# Patient Record
Sex: Male | Born: 1985 | Hispanic: Yes | Marital: Married | Smoking: Never smoker
Health system: Southern US, Community
[De-identification: ages and names within clinical notes are randomized; demographics above are authoritative.]

---

## 2017-08-23 ENCOUNTER — Emergency Department (HOSPITAL_COMMUNITY)
Admission: EM | Admit: 2017-08-23 | Discharge: 2017-08-23 | Disposition: A | Discharge status: Home / Self Care | Payer: Self-pay | Attending: Emergency Medicine | Admitting: Emergency Medicine

## 2017-08-23 ENCOUNTER — Emergency Department (HOSPITAL_COMMUNITY): Payer: Self-pay

## 2017-08-23 ENCOUNTER — Other Ambulatory Visit: Payer: Self-pay

## 2017-08-23 ENCOUNTER — Encounter (HOSPITAL_COMMUNITY): Payer: Self-pay

## 2017-08-23 DIAGNOSIS — F419 Anxiety disorder, unspecified: Secondary | ICD-10-CM | POA: Insufficient documentation

## 2017-08-23 DIAGNOSIS — T43615A Adverse effect of caffeine, initial encounter: Secondary | ICD-10-CM | POA: Insufficient documentation

## 2017-08-23 LAB — CBC WITH DIFFERENTIAL/PLATELET
BASOS ABS: 0 10*3/uL (ref 0.0–0.1)
Basophils Relative: 0 %
EOS PCT: 0 %
Eosinophils Absolute: 0 10*3/uL (ref 0.0–0.7)
HEMATOCRIT: 43.8 % (ref 39.0–52.0)
Hemoglobin: 15.3 g/dL (ref 13.0–17.0)
LYMPHS PCT: 9 %
Lymphs Abs: 1.4 10*3/uL (ref 0.7–4.0)
MCH: 30.7 pg (ref 26.0–34.0)
MCHC: 34.9 g/dL (ref 30.0–36.0)
MCV: 87.8 fL (ref 78.0–100.0)
Monocytes Absolute: 0.6 10*3/uL (ref 0.1–1.0)
Monocytes Relative: 4 %
NEUTROS ABS: 13.4 10*3/uL — AB (ref 1.7–7.7)
Neutrophils Relative %: 87 %
PLATELETS: 264 10*3/uL (ref 150–400)
RBC: 4.99 MIL/uL (ref 4.22–5.81)
RDW: 12.6 % (ref 11.5–15.5)
WBC: 15.3 10*3/uL — AB (ref 4.0–10.5)

## 2017-08-23 LAB — BASIC METABOLIC PANEL
Anion gap: 12 (ref 5–15)
BUN: 13 mg/dL (ref 6–20)
CHLORIDE: 103 mmol/L (ref 98–111)
CO2: 23 mmol/L (ref 22–32)
CREATININE: 0.79 mg/dL (ref 0.61–1.24)
Calcium: 8.4 mg/dL — ABNORMAL LOW (ref 8.9–10.3)
GFR calc non Af Amer: >60 mL/min (ref >60)
Glucose, Bld: 144 mg/dL — ABNORMAL HIGH (ref 70–99)
Potassium: 3.6 mmol/L (ref 3.5–5.1)
SODIUM: 138 mmol/L (ref 135–145)

## 2017-08-23 LAB — URINALYSIS, ROUTINE W REFLEX MICROSCOPIC
BILIRUBIN URINE: NEGATIVE
Bacteria, UA: NONE SEEN
Glucose, UA: NEGATIVE mg/dL
Hgb urine dipstick: NEGATIVE
KETONES UR: NEGATIVE mg/dL
LEUKOCYTES UA: NEGATIVE
NITRITE: NEGATIVE
PROTEIN: 30 mg/dL — AB
Specific Gravity, Urine: 1.012 (ref 1.005–1.030)
pH: 6 (ref 5.0–8.0)

## 2017-08-23 LAB — I-STAT TROPONIN, ED: Troponin i, poc: 0 ng/mL (ref 0.00–0.08)

## 2017-08-23 LAB — RAPID URINE DRUG SCREEN, HOSP PERFORMED
Amphetamines: NOT DETECTED
Barbiturates: NOT DETECTED
Benzodiazepines: NOT DETECTED
COCAINE: NOT DETECTED
OPIATES: NOT DETECTED
Tetrahydrocannabinol: NOT DETECTED

## 2017-08-23 MED ORDER — SODIUM CHLORIDE 0.9 % IV BOLUS: 500.0000 mL | Freq: Once | INTRAVENOUS | Status: DC

## 2017-08-23 MED ORDER — LORAZEPAM 0.5 MG PO TABS
0.5000 mg | ORAL_TABLET | Freq: Once | ORAL | Status: AC
  Administered 2017-08-23: 0.5 mg via ORAL
  Filled 2017-08-23: qty 1

## 2017-08-23 MED ORDER — LORAZEPAM 2 MG/ML IJ SOLN: 1.0000 mg | Freq: Once | INTRAMUSCULAR | Status: DC

## 2017-08-23 NOTE — Discharge Instructions (Signed)
You can take Tylenol or Ibuprofen as directed for pain. You can alternate Tylenol and Ibuprofen every 4 hours. If you take Tylenol at 1pm, then you can take Ibuprofen at 5pm. Then you can take Tylenol again at 9pm.   As we discussed, do not drink any more energy drinks.  Additionally, you should limit your amount of caffeine.  Please follow-up with the referred [facility] for further evaluation.  Return to emergency department for any chest pain, difficulty breathing, fever, numbness or weakness in your arms or legs, vision changes or any other worsening or concerning symptoms.

## 2017-08-23 NOTE — ED Triage Notes (Signed)
Per EMS:  Pt came from urgent care, he had an energy drink at 6:30 am this am, and began to SOB, palpation, and anxiety around 8:00 am.  Pt then had c/o of numbness and tingling in extremities and lips.  Pt denies chest pain. [facility], pulse 113.  800 mL NS were administered by UC

## 2017-08-23 NOTE — ED Provider Notes (Signed)
[facility]-EMERGENCY DEPT Provider Note   CSN: [id number] Arrival date & time: 08/23/17  1101     History   Chief Complaint Chief Complaint  Patient presents with  . Panic Attack    HPI Steve Fuentes is a 32 y.o. male no significant past medical history arrives from urgent care for evaluation of anxiety.  Patient reports that he was at work and started having chest palpations and tightness, difficulty breathing, he started having numbness around his mouth and the tips of his fingers.  Patient reports that he was hyperventilating at the time.  Patient reports he went to urgent care and states that the symptoms are gotten better but patient was found to be tachycardic and hypertensive so they sent him to the emergency department.  Patient reports that he had similar symptoms yesterday after drinking two 16oz NOS energy drinks.  Patient reports he had an additional 12 ounce energy drink this morning prior to onset of symptoms.  On ED arrival, patient states that his symptoms have resolved completely.  He denies any pain at this time.  Patient states that he has not had any more caffeine.  Patient denies any smoking or cocaine use.  He denies any IV drug use.  Patient denies any fever, nausea/vomiting, abdominal pain, chest pain, difficulty breathing, numbness/weakness of his arms or legs.  The history is provided by the patient.    History reviewed. No pertinent past medical history.  There are no active problems to display for this patient.   History reviewed. No pertinent surgical history.      Home Medications    Prior to Admission medications   Not on File    Family History History reviewed. No pertinent family history.  Social History Social History   Tobacco Use  . Smoking status: Never Smoker  . Smokeless tobacco: Never Used  Substance Use Topics  . Alcohol use: Yes    Comment: "on weekends"  . Drug use: Never     Allergies   Patient has  no known allergies.   Review of Systems Review of Systems  Constitutional: Negative for fever.  Eyes: Negative for visual disturbance.  Respiratory: Positive for chest tightness (resolved) and shortness of breath (resolved).   Cardiovascular: Positive for palpitations. Negative for chest pain and leg swelling.  Psychiatric/Behavioral: The patient is nervous/anxious.   All other systems reviewed and are negative.    Physical Exam Updated Vital Signs BP 137/77 (BP Location: Left Arm)   Pulse 88   Temp 98.3 F (36.8 C) (Oral)   Resp (!) 31   Ht 5\' 6"  (1.676 m)   Wt 95.3 kg (210 lb)   SpO2 97%   BMI 33.89 kg/m   Physical Exam  Constitutional: He is oriented to person, place, and time. He appears well-developed and well-nourished.  HENT:  Head: Normocephalic and atraumatic.  Mouth/Throat: Oropharynx is clear and moist and mucous membranes are normal.  Eyes: Pupils are equal, round, and reactive to light. Conjunctivae, EOM and lids are normal.  Neck: Full passive range of motion without pain.  Cardiovascular: Normal rate, regular rhythm, normal heart sounds and normal pulses. Exam reveals no gallop and no friction rub.  No murmur heard. Pulses:      Radial pulses are 2+ on the right side, and 2+ on the left side.  Pulmonary/Chest: Effort normal and breath sounds normal.  Lungs clear to auscultation bilaterally.  Symmetric chest rise.  No wheezing, rales, rhonchi.  Abdominal: Soft. Normal  appearance. There is no tenderness. There is no rigidity and no guarding.  Abdomen is soft, non-distended, non-tender. No rigidity, No guarding. No peritoneal signs.  Musculoskeletal: Normal range of motion.  Neurological: He is alert and oriented to person, place, and time.  Skin: Skin is warm and dry. Capillary refill takes less than 2 seconds.  Psychiatric: His speech is normal. His mood appears anxious.  Nursing note and vitals reviewed.    ED Treatments / Results  Labs (all labs  ordered are listed, but only abnormal results are displayed) Labs Reviewed  URINALYSIS, ROUTINE W REFLEX MICROSCOPIC - Abnormal; Notable for the following components:      Result Value   Protein, ur 30 (*)    All other components within normal limits  CBC WITH DIFFERENTIAL/PLATELET - Abnormal; Notable for the following components:   WBC 15.3 (*)    Neutro Abs 13.4 (*)    All other components within normal limits  BASIC METABOLIC PANEL - Abnormal; Notable for the following components:   Glucose, Bld 144 (*)    Calcium 8.4 (*)    All other components within normal limits  RAPID URINE DRUG SCREEN, HOSP PERFORMED  I-STAT TROPONIN, ED    EKG EKG Interpretation  Date/Time:  Thursday August 23 2017 13:25:23 EDT Ventricular Rate:  100 PR Interval:    QRS Duration: 86 QT Interval:  339 QTC Calculation: 438 R Axis:   109 Text Interpretation:  Right and left arm electrode reversal, interpretation assumes no reversal Sinus tachycardia Probable lateral infarct, age indeterminate no prior to compare with Confirmed by Meridee Score [phone]) on 08/23/2017 1:47:38 PM   Radiology Dg Chest 2 View  Result Date: 08/23/2017 CLINICAL DATA:  Pt came from urgent care, he had an energy drink at 6:30 am this am, and began to SOB, palpation, and anxiety around 8:00 am. Pt then had c/o of numbness and tingling in extremities and lips. Pt denies chest pain. EXAM: CHEST - 2 VIEW COMPARISON:  None. FINDINGS: The heart size and mediastinal contours are within normal limits. Both lungs are clear. No pleural effusion or pneumothorax. The visualized skeletal structures are unremarkable. IMPRESSION: No active cardiopulmonary disease. Electronically Signed   By: Amie Portland M.D.   On: 08/23/2017 14:21    Procedures Procedures (including critical care time)  Medications Ordered in ED Medications  sodium chloride 0.9 % bolus 500 mL (500 mLs Intravenous Not Given 08/23/17 1502)  LORazepam (ATIVAN) tablet 0.5 mg  (0.5 mg Oral Given 08/23/17 1509)     Initial Impression / Assessment and Plan / ED Course  I have reviewed the triage vital signs and the nursing notes.  Pertinent labs & imaging results that were available during my care of the patient were reviewed by me and considered in my medical decision making (see chart for details).  Clinical Course as of Aug 23 1609  Thu Aug 23, 2017  [id number] 32 year old male here with shortness of breath and palpitations in the setting of drinking energy drinks.  He is otherwise well-appearing here.  His labs including troponin are unremarkable other than a mild elevation in his WBCs which is nonspecific.  He is feeling improved here 2 and barring a abnormal chest x-ray is likely to be discharged soon.   [MB]    Clinical Course User Index [MB] Terrilee Files, MD    31 y.o. M who presents for evaluation of anxiety, palpitations, chest tightness, shortness of breath began this morning.  Patient reports that he  drinks 216 ounce energy drink yesterday followed by another 16 ounce energy drinks and coffee this morning.  Patient reports that shortly after consuming the strings, he started feeling very anxious felt like his heart was beating very fast.  He describes a tightness.  Patient became very anxious.  He was taken to urgent care.  At that time, he is not been tachycardic, hypertensive and tachypneic.  Patient was sent to the ED for further evaluation.  On ED arrival, patient reports his pain is symptomatically improved.  He states he does not have any chest pain or difficulty breathing.  Additionally his numbness has improved.  Patient does report he still feels nervous.  Sometimes having palpitations.  Patient has no personal cardiac history.  Consider that this is a result of caffeine intake.  Low suspicion for ACS pathology but also consideration.  Also consider infectious etiology.  History/physical exam is not concerning for PE, endocarditis, myocarditis.  BMP  shows hyperglycemia.  Otherwise unremarkable.  Troponin is negative.  Urine drug screen negative for any drugs.  UA negative for any acute infectious etiology.  CBC shows leukocytosis of 15.3.  Likely stress reactive process.  Chest x-ray negative for any acute infectious etiology.  Evaluation.  Patient reports that he is not having any symptoms.  Vital signs are stable.  At this time, believe patient's symptoms were a result of increased caffeine intake.  I discussed with patient that he should no longer take any energy drinks or increased caffeine intake.  Additionally, I think patient had some episodes of anxiety as he became very nervous because he was concerned about his heart.  At this time, do not suspect ACS etiology or PE to be the etiology of patient's symptoms. At this time, patient exhibits no emergent life-threatening condition that require further evaluation in ED. Patient had ample opportunity for questions and discussion. All patient's questions were answered with full understanding. Strict return precautions discussed. Patient expresses understanding and agreement to plan.   Final Clinical Impressions(s) / ED Diagnoses   Final diagnoses:  Anxiety  Adverse effect of caffeine, initial encounter    ED Discharge Orders    None       Rosana HoesLayden, Lupe Bonner A, PA-C 08/23/17 1611    Terrilee FilesButler, Michael C, MD 08/23/17 [phone]

## 2017-08-23 NOTE — ED Notes (Signed)
Bed: WA07 Expected date: 08/23/17 Expected time: 10:58 AM Means of arrival: Ambulance Comments: Anxiety after drinking energy drinks-from urgent care

## 2019-03-01 IMAGING — DX DG CHEST 2V
2 series · 2 of 2 positions shown · non-contrast
Comparison: None.

CLINICAL DATA: Pt came from [HOSPITAL], he had an energy drink at
[DATE] this am, and began to SOB, palpation, and anxiety around
[DATE]. Pt then had c/o of numbness and tingling in extremities and
lips. Pt denies chest pain.

EXAM:
CHEST - 2 VIEW

[chest pa]
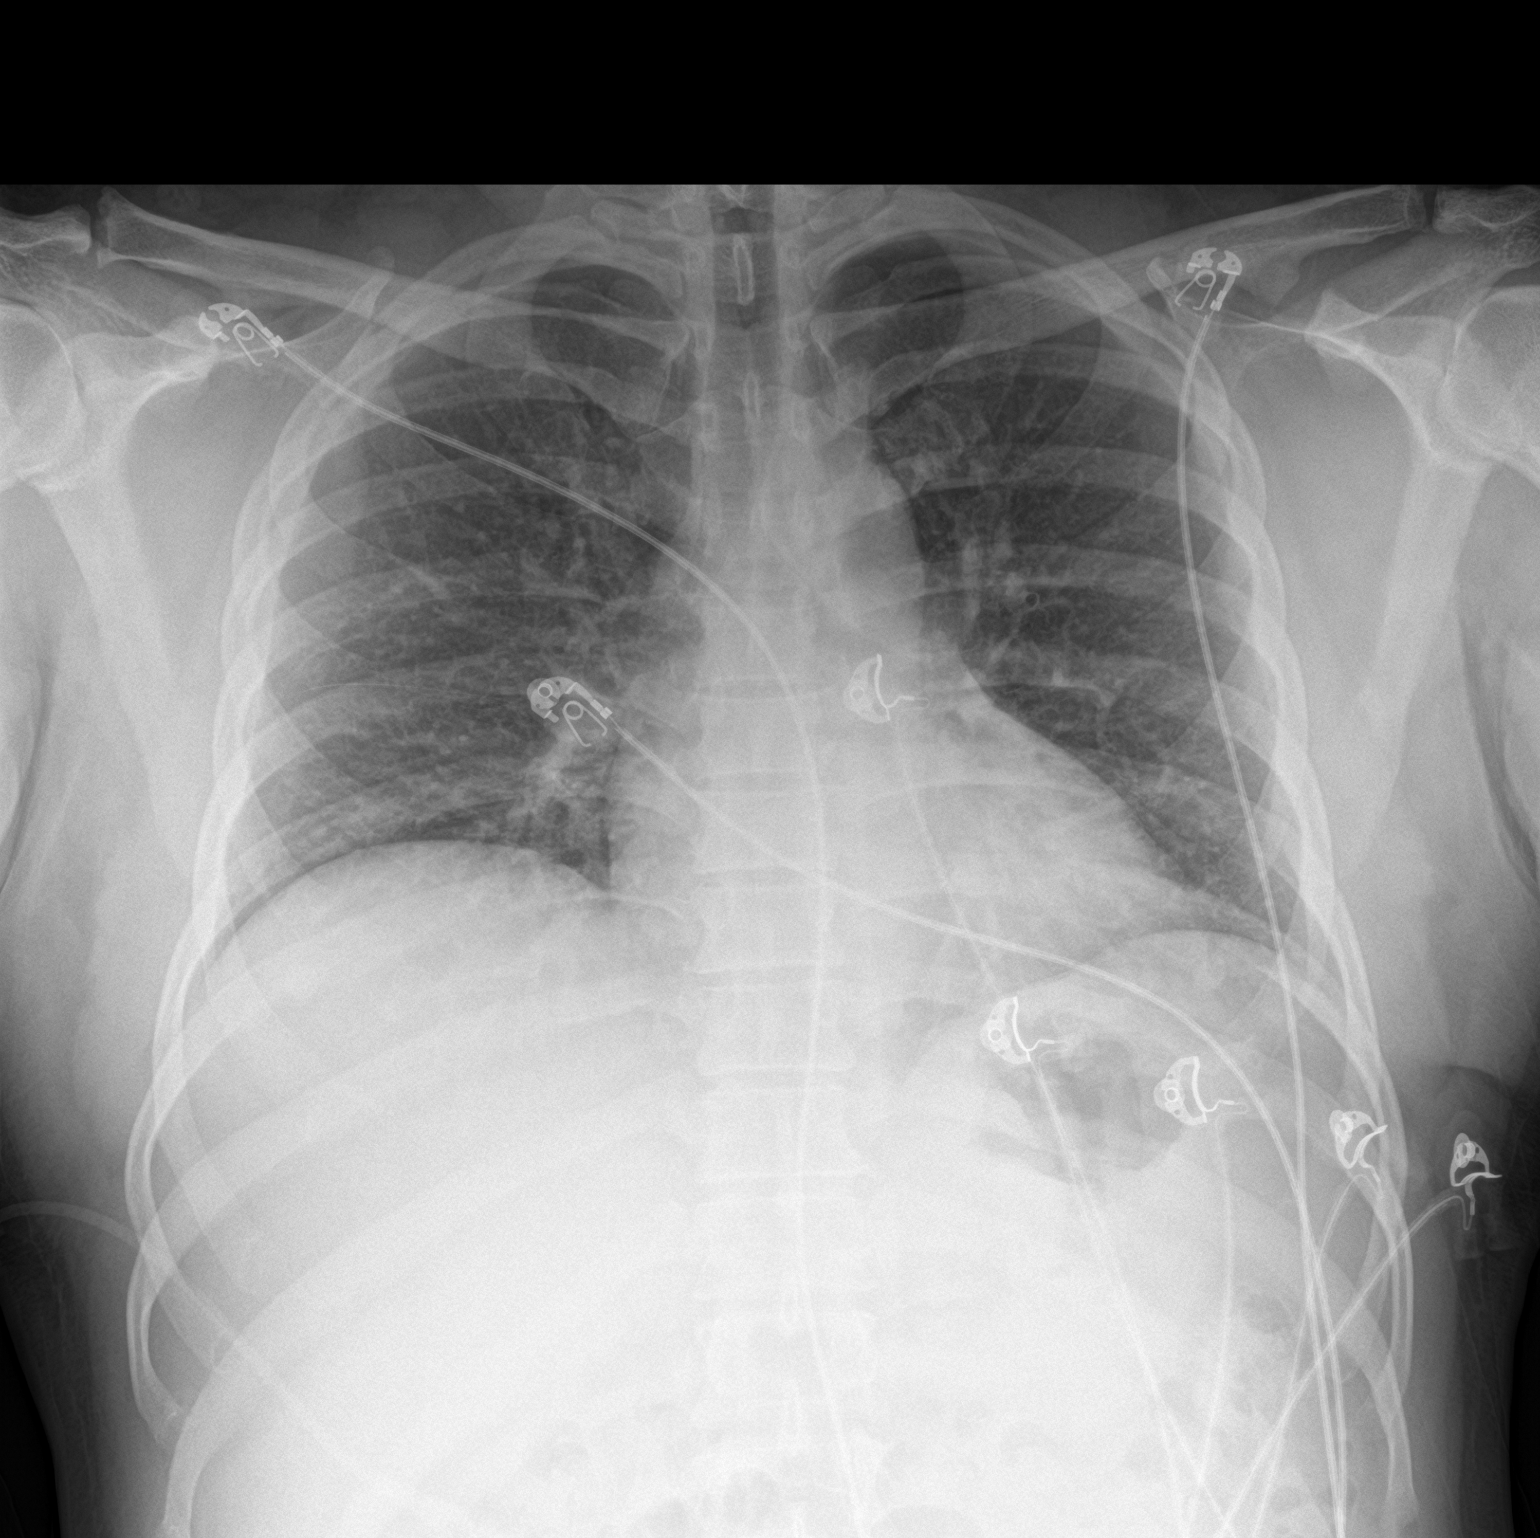

[chest lat]
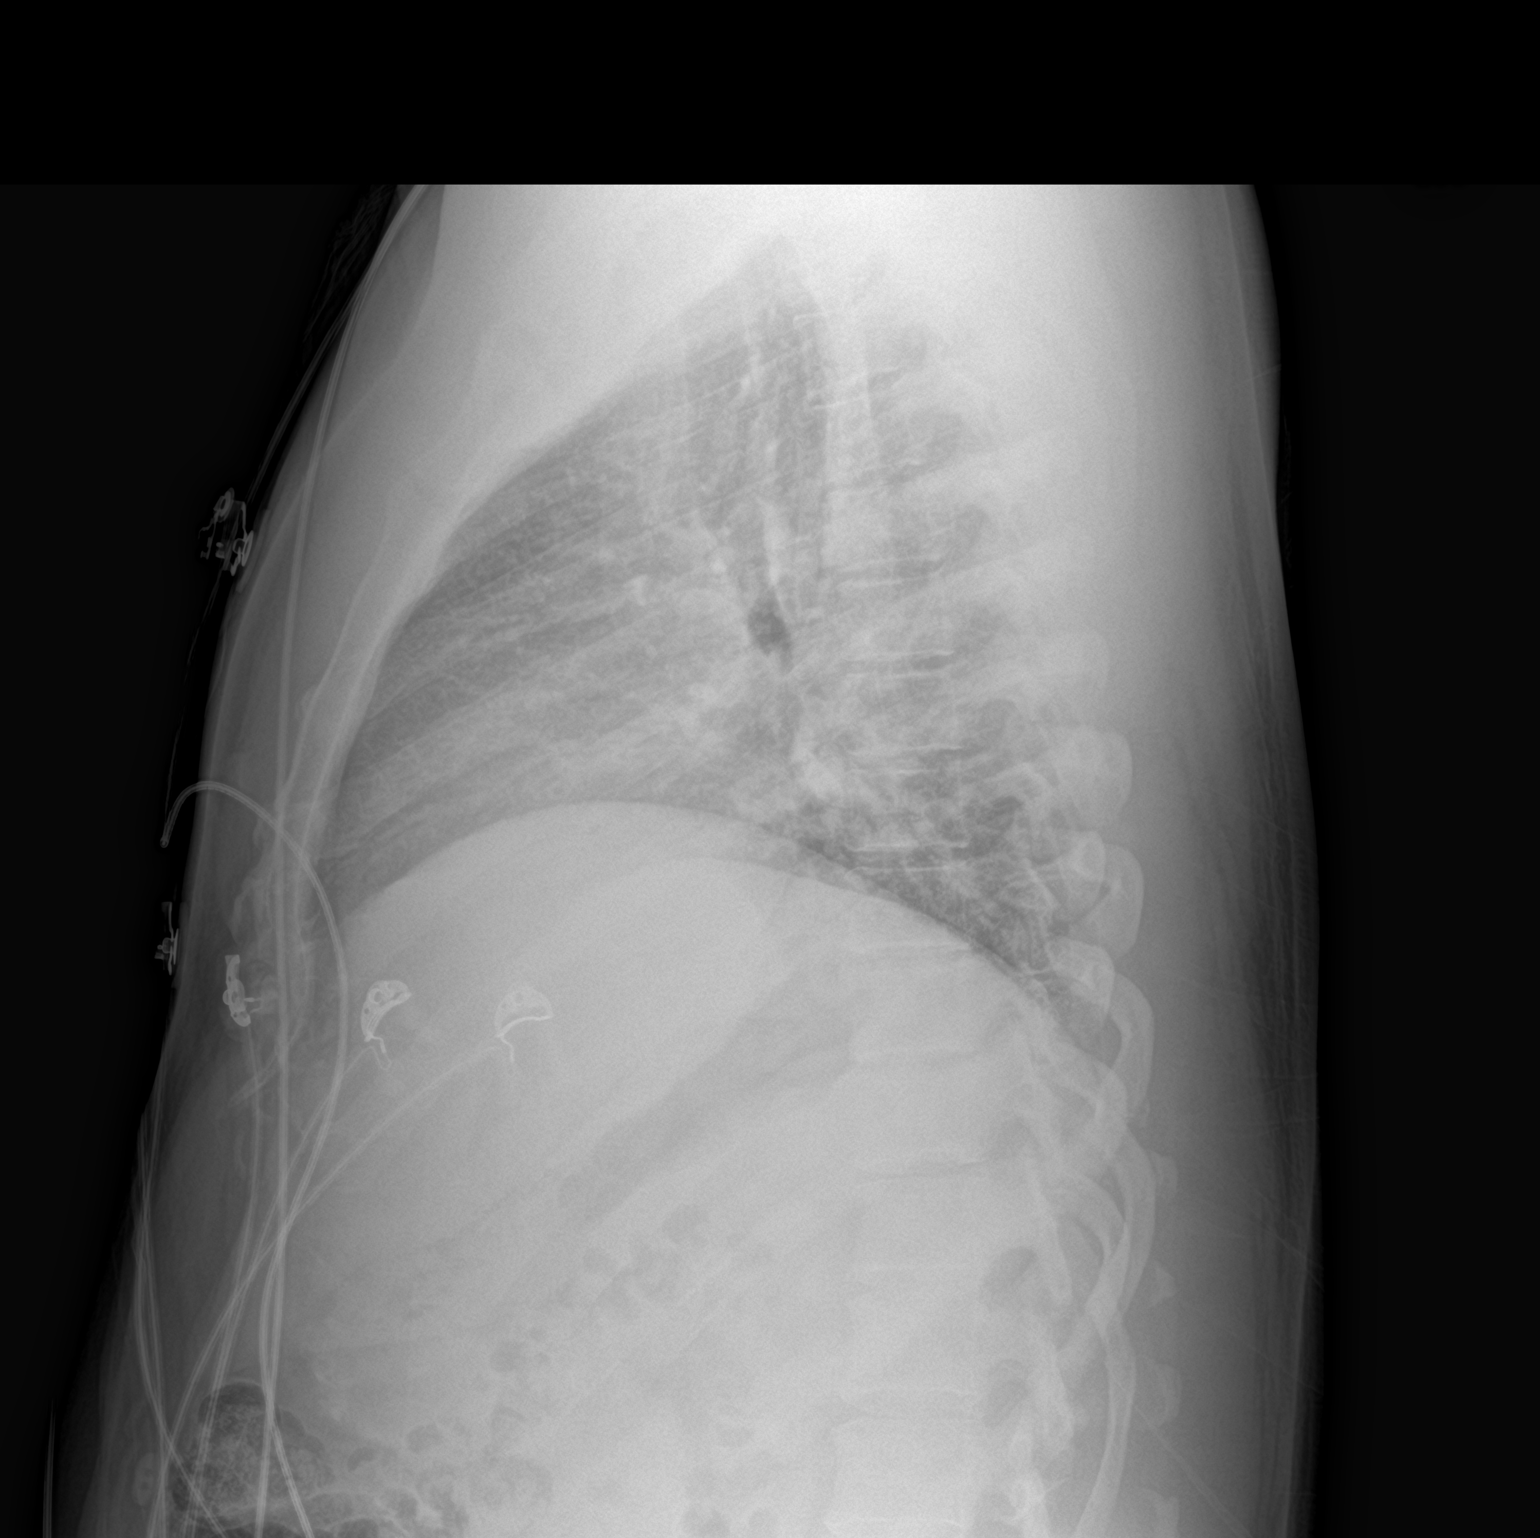

[2 of 2 positions shown; findings below may reference images not displayed]

FINDINGS: The heart size and mediastinal contours are within normal limits.
Both lungs are clear. No pleural effusion or pneumothorax. The
visualized skeletal structures are unremarkable.
IMPRESSION: No active cardiopulmonary disease.
